# Patient Record
Sex: Male | Born: 1960 | Race: White | Hispanic: No | Marital: Married
Health system: Southern US, Community
[De-identification: ages and names within clinical notes are randomized; demographics above are authoritative.]

---

## 2004-12-25 ENCOUNTER — Emergency Department: Payer: Self-pay | Admitting: Emergency Medicine

## 2009-03-25 ENCOUNTER — Observation Stay: Payer: Self-pay | Admitting: Internal Medicine

## 2011-03-18 ENCOUNTER — Inpatient Hospital Stay: Payer: Self-pay | Admitting: Internal Medicine

## 2011-03-18 LAB — CK TOTAL AND CKMB (NOT AT ARMC)
CK, Total: 65 U/L (ref 35–232)
CK-MB: 2.2 ng/mL (ref 0.5–3.6)

## 2011-03-18 LAB — COMPREHENSIVE METABOLIC PANEL
Albumin: 3.6 g/dL (ref 3.4–5.0)
Anion Gap: 14 (ref 7–16)
Calcium, Total: 8.9 mg/dL (ref 8.5–10.1)
Chloride: 102 mmol/L (ref 98–107)
Co2: 23 mmol/L (ref 21–32)
EGFR (Non-African Amer.): >60
Osmolality: 281 (ref 275–301)
Potassium: 4.3 mmol/L (ref 3.5–5.1)
SGOT(AST): 28 U/L (ref 15–37)
Total Protein: 7.5 g/dL (ref 6.4–8.2)

## 2011-03-18 LAB — CBC
HCT: 52.8 % — ABNORMAL HIGH (ref 40.0–52.0)
HGB: 17.8 g/dL (ref 13.0–18.0)
MCH: 30.3 pg (ref 26.0–34.0)
MCHC: 33.7 g/dL (ref 32.0–36.0)
MCV: 90 fL (ref 80–100)
Platelet: 155 10*3/uL (ref 150–440)
RBC: 5.86 10*6/uL (ref 4.40–5.90)
RDW: 13.3 % (ref 11.5–14.5)
WBC: 12.9 10*3/uL — ABNORMAL HIGH (ref 3.8–10.6)

## 2011-03-18 LAB — APTT: Activated PTT: 34.4 secs (ref 23.6–35.9)

## 2011-03-18 LAB — HEMOGLOBIN A1C: Hemoglobin A1C: 6.6 % — ABNORMAL HIGH (ref 4.2–6.3)

## 2011-03-18 LAB — TROPONIN I
Troponin-I: 0.17 ng/mL — ABNORMAL HIGH
Troponin-I: 0.18 ng/mL — ABNORMAL HIGH

## 2011-03-18 LAB — PRO B NATRIURETIC PEPTIDE: B-Type Natriuretic Peptide: 1654 pg/mL — ABNORMAL HIGH (ref 0–125)

## 2011-03-18 LAB — PROTIME-INR
INR: 1
Prothrombin Time: 13.7 secs (ref 11.5–14.7)

## 2011-03-20 LAB — APTT: Activated PTT: 49.2 secs — ABNORMAL HIGH (ref 23.6–35.9)

## 2012-11-16 IMAGING — CT CT CHEST W/ CM
2 series · 15 of 35 positions shown, 18 images · IV contrast (APPLIED)
Comparison: none

REASON FOR EXAM: COMMENTS:

[Series 5: soft tissue · axial · 0.75mm/px · z∈[-280,-2]mm · 12 of 111 slices shown, 15 images]
[im 9/111  mediastinal]
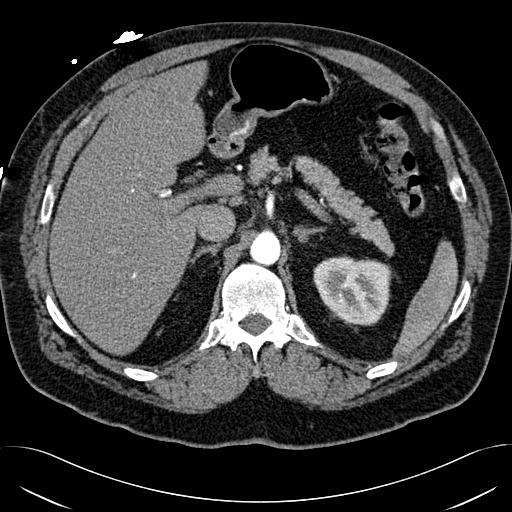
[im 9/111  lung]
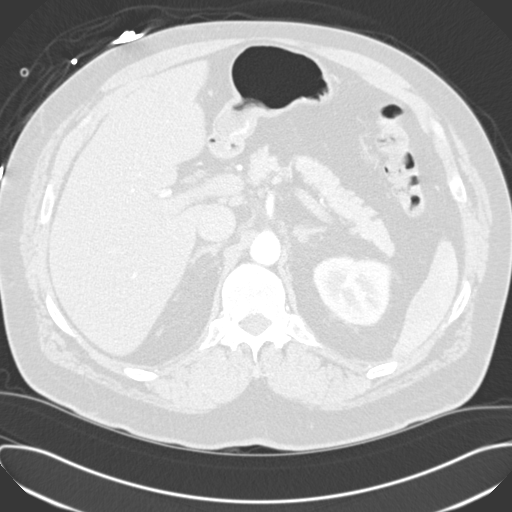
[im 17/111  lung]
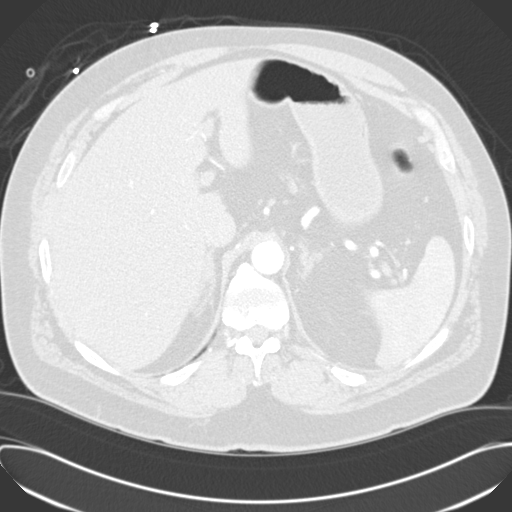
[im 25/111  lung]
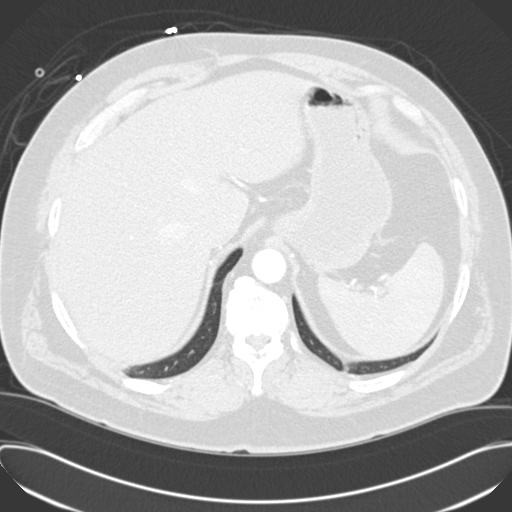
[im 33/111  lung]
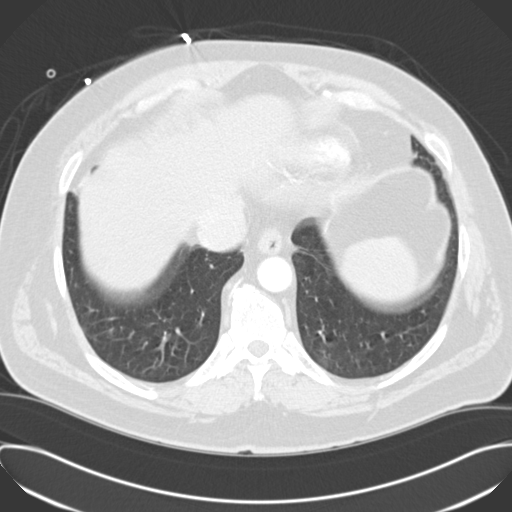
[im 45/111  mediastinal]
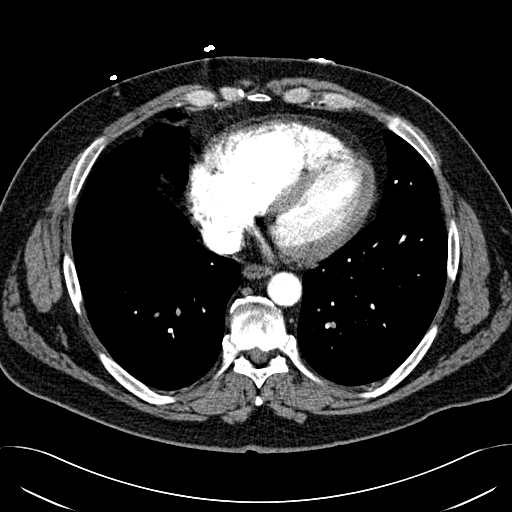
[im 45/111  lung]
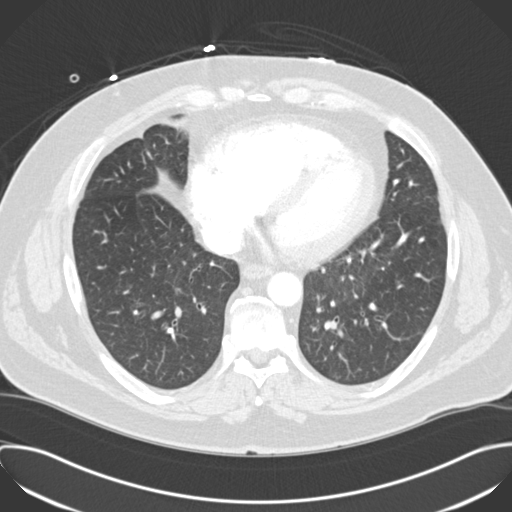
[im 53/111  lung]
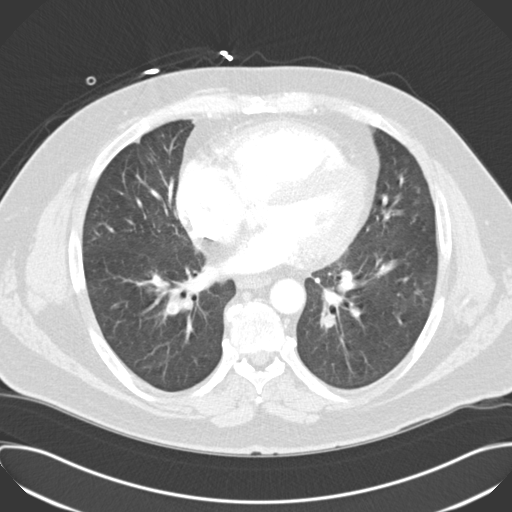
[im 59/111  lung]
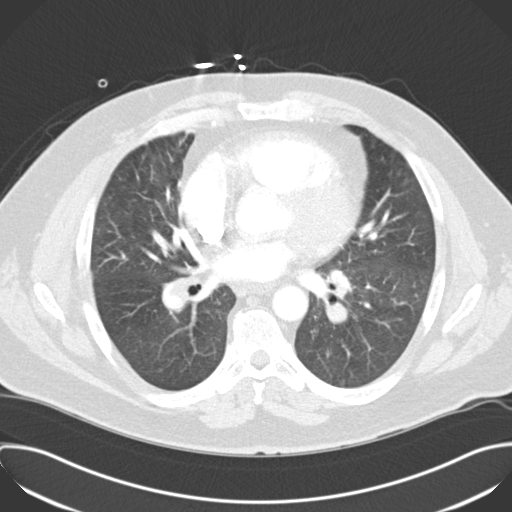
[im 66/111  lung]
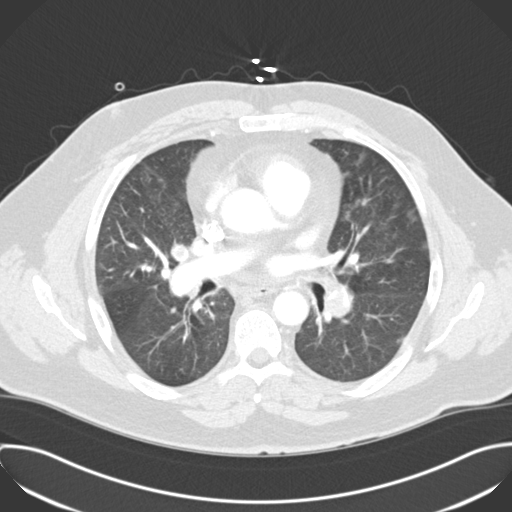
[im 78/111  mediastinal]
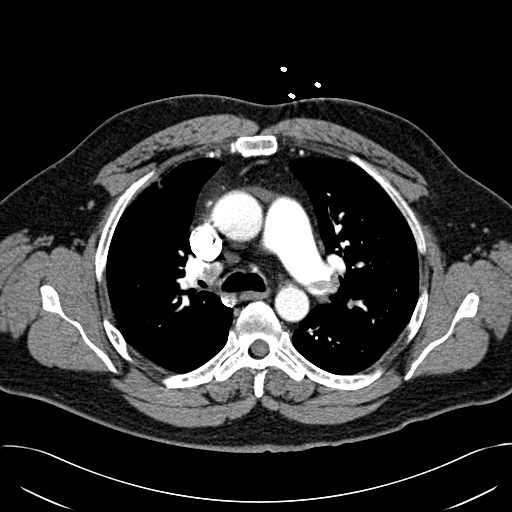
[im 78/111  lung]
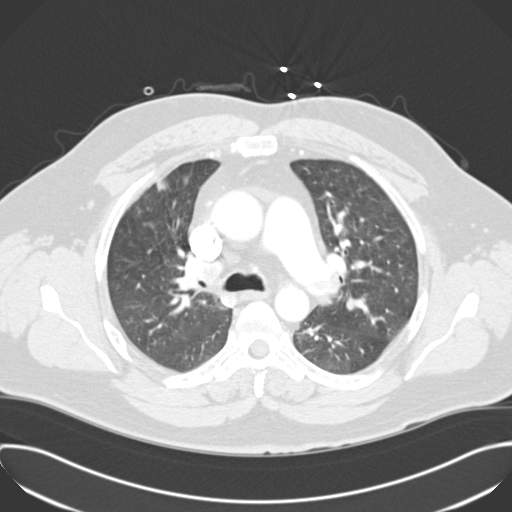
[im 86/111  lung]
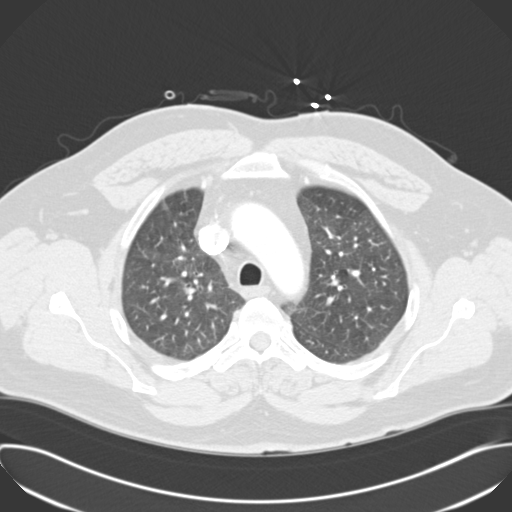
[im 94/111  lung]
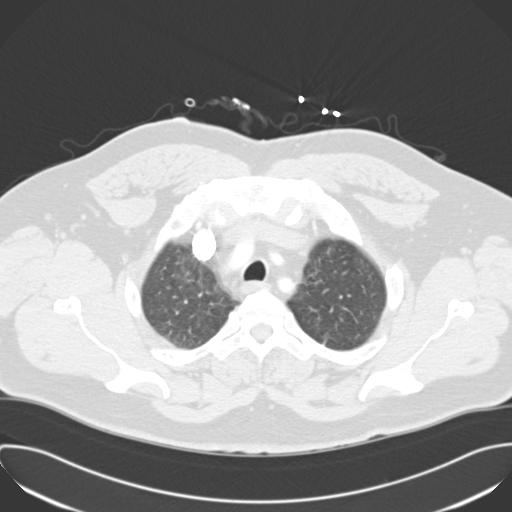
[im 102/111  lung]
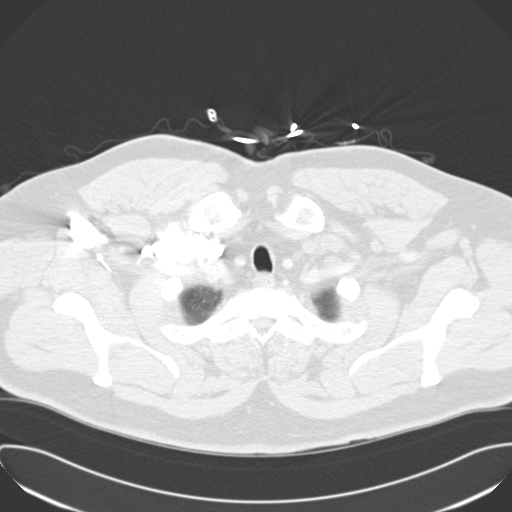

[Series 602: coronals · coronal · 0.75mm/px · 3 of 48 slices shown]
[im 10/48  lung]
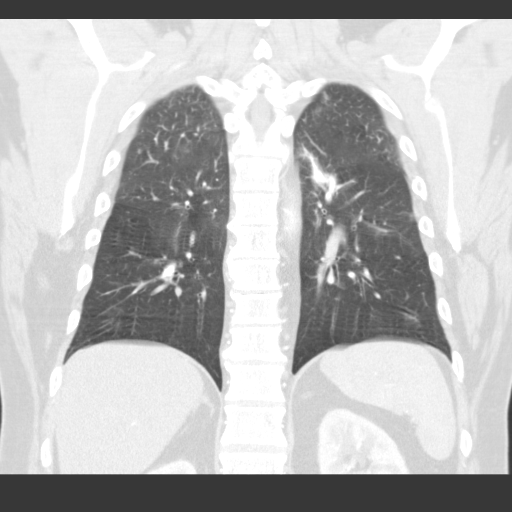
[im 19/48  lung]
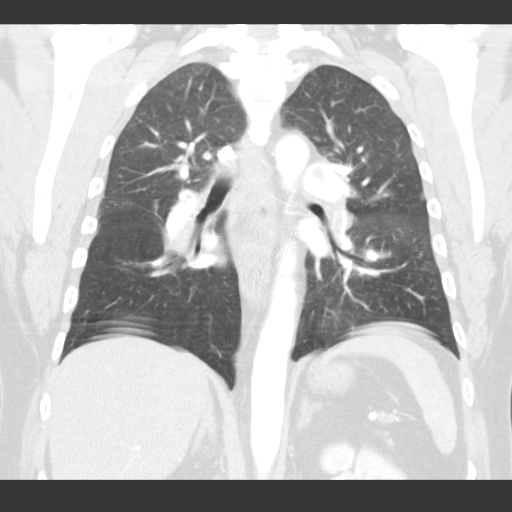
[im 29/48  lung]
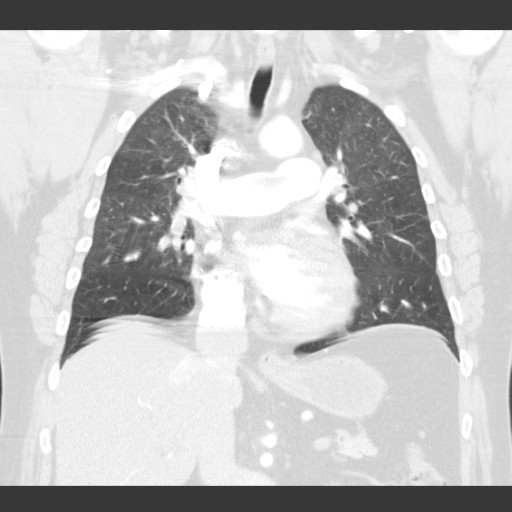

[15 of 35 positions shown; findings below may reference images not displayed]

PROCEDURE:     CT  - CT CHEST (FOR PE) W  - March 18, 2011  [DATE]

RESULT:      Axial CT scanning was performed through the chest with
reconstructions at 3 mm intervals and slice thicknesses following
intravenous administration of 100 cc of Dsovue-VXY. Review of multiplanar
reconstructed images was performed separately on the VIA monitor.

There are large filling defects in the right and left main pulmonary
arteries as well as in the major right and left branches of the pulmonary
arteries. The cardiac chambers are normal in size. The caliber of the
thoracic aorta is normal. There is no pleural nor pericardial effusion. I
see no bulky mediastinal or hilar lymph nodes. At lung window settings I do
not see alveolar infiltrates. There is minimal patchy pleural-based density
anteriorly in the right upper lobe on image 35. I do not see abnormal
nodules. Within the upper abdomen the observed portions of the liver exhibit
decreased density consistent with fatty infiltration. The spleen is not
enlarged. I see no adrenal masses. The bony thorax exhibits no acute
abnormality.
IMPRESSION: 1. There is a saddle pulmonary thrombus with multiple bilateral pulmonary
emboli involving both central and peripheral vessels. Minimal patchy density
in the anterior aspect of the right upper lobe could reflect focal pulmonary
infarction.
2. There is no overt evidence of CHF nor of pneumonia. I see no acute
thoracic aortic pathology.
3. There is no evidence of pleural nor pericardial effusions.

This report was discussed by telephone with Dr. Mirella in the emergency
department by the [REDACTED] physician at [DATE] a.m. on [DATE]

(*)

## 2013-01-01 ENCOUNTER — Ambulatory Visit: Payer: Self-pay

## 2013-01-01 LAB — URINALYSIS, COMPLETE
Leukocyte Esterase: NEGATIVE
Ph: 6 (ref 4.5–8.0)

## 2014-05-26 NOTE — Consult Note (Signed)
Brief Consult Note: Diagnosis: PE/SOB/Elevated troponin.   Patient was seen by consultant.   Consult note dictated.   Recommend further assessment or treatment.   Orders entered.   Discussed with Attending MD.   Comments: IMP Lagre PE Obesity Elevated troponin SOB Smoking . PLAN EKGs  02 Anticoug with heparin Which to long term anticoug Consider hematology input ECHO Advise to quit smoking Conservative cardiology input.  Electronic Signatures: Dorothyann Pengallwood, Dwayne D (MD)  (Signed 14-Feb-13 14:06)  Authored: Brief Consult Note   Last Updated: 14-Feb-13 14:06 by Alwyn Peaallwood, Dwayne D (MD)

## 2014-05-26 NOTE — H&P (Signed)
PATIENT NAME:  Benjamin Arellano, Benjamin Arellano MR#:  [id number] DATE OF BIRTH:  May 09, 1960  DATE OF ADMISSION:  03/18/2011  PRIMARY CARE PHYSICIAN: No local doctor, he sees a physician in [place].  CHIEF COMPLAINT: Increased shortness of breath and lightheadedness.   HISTORY OF PRESENT ILLNESS: Benjamin Arellano is a 54 year old Caucasian male with unremarkable past medical history apart from tobacco abuse. He was in his usual state of health until this Monday, about three days ago, when he started to feel more shortness of breath. In fact he says that his shortness of breath was gradual for the last one month associated with cough and bronchitis-like symptoms at that time; however, three days ago it was more intense and associated with lightheadedness to the extent that when he walks he feels that he is going to faint. He also reported sweating. He denies having any chest pain, but reported that when he is breathing hard he will feel some chest discomfort or upon coughing. He denies having any sudden onset of chest pain and no syncope. He has no fever and no chills.   REVIEW OF SYSTEMS: CONSTITUTIONAL: Denies fever. No chills. He admits having sweating. EYES: No blurring of vision. No double vision. ENT: No hearing impairment. No sore throat. No dysphagia. CARDIOVASCULAR: No chest pain. Only mild pleuritic pain upon coughing. Admits having shortness of breath, as above. No edema. No syncope, but has near syncope feeling. RESPIRATORY: Admits having shortness of breath and mild pleuritic chest pain. No wheezing. GASTROINTESTINAL: No abdominal pain, no nausea, no vomiting, and no diarrhea. GENITOURINARY: No dysuria or frequency of urination. MUSCULOSKELETAL: No joint swelling or pain. No muscular pain or swelling. However, he has some back pain that started here while he is lying down on the stretcher. INTEGUMENTARY: No skin rash. No ulcers. NEUROLOGY: No focal weakness. No seizure activity. He has mild headache. PSYCHIATRY:  Denies depression. He is slightly anxious here. ENDOCRINE: Admits having sweating, but no heat or cold intolerance.   PAST MEDICAL HISTORY:  1. He has history of kidney stones, otherwise he is healthy. 2. Chronic smoking.  3. One episode of pneumonia two years ago.   SOCIAL HABITS: Chronic smoker of 1 pack per day since age 46. No history of alcohol abuse.   FAMILY HISTORY: Negative for pulmonary embolus or any lung disease. Negative for premature coronary artery disease. He reports that his grandfather had a myocardial infarction but that was in his late 31s.   SOCIAL HISTORY: He is married and living with his wife. He owns a body shop.   ADMISSION MEDICATIONS:  1. PRN intake of hydrocodone when he has a kidney stone. 2. Occasional Naprosyn.   ALLERGIES: No known drug allergies.   PHYSICAL EXAMINATION:   VITAL SIGNS: Blood pressure 98/70, respiratory rate 22, pulse 100.   GENERAL APPEARANCE: Middle-aged male lying down in the bed, in no acute distress.   HEAD/NECK: No pallor. No icterus. No cyanosis.  EARS, NOSE, AND THROAT:  Hearing was normal. Nasal mucosa, lips, and tongue were normal.   EYES: Normal eyelids and conjunctiva. Pupils are about 4 to 5 mm, equal and reactive to light.   NECK: Supple. Trachea at midline. No thyromegaly. No cervical lymphadenopathy. No masses.   HEART: Normal S1 and S2. No S3 or S4. No murmur. No gallop. No carotid bruits.   LUNGS: Normal breathing pattern without use of accessory muscles. No rales. No wheezing.   ABDOMEN: Soft without tenderness. No hepatosplenomegaly. No masses. No hernias.   MUSCULOSKELETAL:  No joint swelling. No clubbing.   SKIN: No ulcers. No subcutaneous nodules.   NEUROLOGIC: Cranial nerves II through XII were intact. No focal motor deficit.   PSYCHIATRIC: The patient is alert and oriented x3. Mood and affect were flat.   LABS/STUDIES: EKG showed sinus tachycardia at rate of 105 per minute. Tendency towards low  voltage. Poor progression of R waves in the anterior chest leads and incomplete right bundle branch block. Inverted T waves in leads V1 to V4.   CAT or CTA of the chest revealed saddle pulmonary embolus with extension into the segmental pulmonary arteries of all lungs bilaterally. Minor parenchymal opacities which could be secondary to ischemic change.   B-type natriuretic peptide was significantly elevated at 1654. Glucose was 186, BUN 10, creatinine 0.9, serum sodium 139, and potassium 4.3. Liver function tests were normal. Total CPK is 65. Normal CK-MB fraction, 2.2. However, troponin was elevated to 0.17. CBC showed white count of 12,000, hemoglobin 17, hematocrit 52, and platelet count 155. Prothrombin time was 13, INR 1, APTT 34, and d-dimer was elevated at 4.1.   ASSESSMENT:  1. Pulmonary embolus in the form of saddle pulmonary embolus. 2. Mild elevation of troponin and abnormal EKG. This could be either secondary to the pulmonary embolus with right ventricular strain or myocardial ischemia secondary to coronary artery disease given his heavy smoking history.  3. Elevated blood sugar, may indicate underlying diabetes. I will check his Hemoglobin A1c.  4. Tobacco abuse.  5. History of kidney stones.   PLAN: Admit the patient to the Intensive Care Unit. IV heparin was already started in the emergency department. I will consult pulmonary. At the time being, there is no indication to use TPA as there is no significant hemodynamic compromise. I ordered an echocardiogram, and I will also obtain cardiology consult. I will avoid adding beta blocker in this setting, since his blood pressure is borderline, and we will await cardiology input. Peptic ulcer disease prophylaxis          will be initiated with Protonix 40 mg a day. Deep vein thrombosis prophylaxis is already started with heparin. I advised the patient to quit smoking indefinitely. I placed him on a nicotine patch.   TIME NEEDED TO  EVALUATE THIS PATIENT: More than one hour.  ____________________________ Benjamin CornersAmir M. Rudene Rearwish, MD amd:slb D: 03/18/2011 04:37:58 ET T: 03/18/2011 08:44:14 ET JOB#: [id number]  cc: Benjamin CornersAmir M. Rudene Rearwish, MD, <Dictator> Zollie ScaleAMIR M Virgilene Stryker MD ELECTRONICALLY SIGNED 03/26/2011 22:17

## 2014-05-26 NOTE — Discharge Summary (Signed)
PATIENT NAME:  Benjamin Arellano, Benjamin Arellano MR#:  [id number] DATE OF BIRTH:  Sep 06, 1960  DATE OF ADMISSION:  03/18/2011 DATE OF DISCHARGE:  03/20/2011  DIAGNOSES:  1. Pulmonary embolism with right lower extremity deep vein thrombosis.  2. Elevated troponin due to demand ischemia resulting from the pulmonary embolism. 3. Hyperglycemia. 4. Leukocytosis.  5. Tobacco abuse.  6. History of kidney stones.   DISPOSITION: The patient is being discharged home.   FOLLOWUP: Follow up with PCP in 1 to 2 days. The patient has been advised to get a PT-INR check-up on 03/22/2011, Monday, at his primary care physician's office.   DIET: Regular.   ACTIVITY: As tolerated.   DISCHARGE MEDICATIONS:  1. Coumadin 10 mg daily.  2. Lovenox 120 mg subcutaneously b.i.d. for five days. Discontinue when INR is more than 2.  LABORATORY, DIAGNOSTIC, AND RADIOLOGICAL DATA: CT scan of the chest showed subtle pulmonary thrombus with multiple bilateral pulmonary emboli. Lower extremity Doppler showed right deep vein thrombosis. Echo: LV grossly normal in size. There is no thrombus. Left ventricular systolic function is normal. Ejection fraction more than 55%. Normal left ventricular wall thickness. Right ventricular systolic function is normal. CBC normal other than white count 12.9. Troponins ranging from 0.18 to 0.13. Glucose 186 with hemoglobin of 6.6.   CONSULTATIONS:  1. Vascular surgery consultation with Dr. Wyn Quakerew.  2. Pulmonary consultation with Dr. Welton FlakesKhan. 3. Cardiology consultation with Dr. Juliann Paresallwood.   PROCEDURES: IVC filter placement.   HOSPITAL COURSE: The patient is a 54 year old male with past medical history of kidney stones who presented with pleuritic type chest pain and shortness of breath. A CT scan of the chest revealed saddle pulmonary embolus. The patient was also hypoxic. Initially he was monitored for any hemodynamic instability in the Intensive Care Unit for 24 hours. Fortunately, he did not develop any. He  was treated with IV heparin drip and oral Coumadin. A vascular surgery consultation with Dr. Wyn Quakerew was obtained and the patient underwent IVC filter placement on 02/15/2011. Pulmonary and cardiology consultations were also obtained because of extensive pulmonary embolus. Conservative management was recommended. The patient had elevated troponins and BNP with abnormal EKG due to right heart strain resulting from extensive pulmonary embolus. His echo was normal. He also had hyperglycemia with hemoglobin A1c of 6.3. The patient had an extensive history of smoking and was counseled; however, he is currently not interested in quitting. The patient was desirous of going home. Therefore, he was taught to self-administer subcutaneous Lovenox. He is being discharged home on subcutaneous Lovenox and oral Coumadin. He has been advised to have a PT-INR check-up in his primary care physician's office on 03/22/2011.  He is to discontinue the Lovenox once his INR is more than 2.  The patient is being discharged home in stable condition.   TIME SPENT: 45 minutes.    ____________________________ Darrick MeigsSangeeta Mikayla Chiusano, MD sp:bjt D: 03/20/2011 13:32:21 ET T: 03/21/2011 10:01:54 ET JOB#: [id number]  cc: Darrick MeigsSangeeta Kynley Metzger, MD, <Dictator> Darrick MeigsSANGEETA Deniesha Stenglein MD ELECTRONICALLY SIGNED 03/23/2011 11:28

## 2014-05-26 NOTE — Op Note (Signed)
PATIENT NAME:  Benjamin Arellano, Benjamin Arellano MR#:  [id number] DATE OF BIRTH:  Jun 25, 1960  DATE OF PROCEDURE:  03/18/2011  PREOPERATIVE DIAGNOSES:  1. Submassive PE with tachycardia and heart strain.  2. Residual right lower extremity DVT.   POSTOPERATIVE DIAGNOSES: 1. Submassive PE with tachycardia and heart strain.  2. Residual right lower extremity DVT.   PROCEDURES: 1. Ultrasound guidance for vascular access, right femoral vein.  2. Catheter placement into inferior vena cava.  3. Inferior venacavogram.  4. Placement of a [employer] IVC filter.   SURGEON: Annice NeedyJason S. Jamieson Lisa, MD   ANESTHESIA: Local with Versed in addition.   ESTIMATED BLOOD LOSS: Minimal.   FLUOROSCOPY TIME: Less than one minute.  CONTRAST USED: 15 mL.    INDICATION FOR PROCEDURE: This is a 54 year old male who presented today with shortness of breath and chest pain and was found to have a submassive pulmonary embolus. He has tachycardia and elevated BNP consistent with heart strain. He has residual right lower extremity DVT. For these reasons, we are consulted for IVC filter. Risks and benefits were discussed. Informed consent was obtained.   DESCRIPTION OF PROCEDURE: The patient was brought to the Vascular Interventional Radiology Suite. Groins were shaved and prepped and a sterile surgical field was created. The right femoral vein was visualized with ultrasound and found to be widely patent. It was then accessed under direct ultrasound guidance without difficulty with a Seldinger needle and permanent image was recorded. J-wire was placed. After skin nick and dilatation, the delivery sheath was placed into the inferior vena cava and inferior venacavogram was performed. This showed a patent vena cava at the level of the renal veins at approximately L1. The filter was then deployed at the L1-L2 interspace. The delivery sheath was removed. Pressure was held. Sterile dressing was placed. The patient tolerated the procedure well and was  taken to the recovery room in stable condition.   ____________________________ Annice NeedyJason S. Oryon Gary, MD jsd:drc D: 03/18/2011 16:25:31 ET T: 03/18/2011 17:13:44 ET JOB#: [id number]  cc: Annice NeedyJason S. Jisela Merlino, MD, <Dictator> Annice NeedyJASON S Merelyn Klump MD ELECTRONICALLY SIGNED 03/22/2011 10:33

## 2014-05-26 NOTE — Consult Note (Signed)
PATIENT NAME:  Benjamin Arellano, Maher C MR#:  [id number] DATE OF BIRTH:  [birth year]  DATE OF CONSULTATION:  03/18/2011  REFERRING PHYSICIAN:  [facility] CONSULTING PHYSICIAN:  Kamaal Cast D. Conall Vangorder, MD  PRIMARY CARE PHYSICIAN:    INDICATION: Shortness of breath, near syncope, elevated troponins in the setting of large pulmonary embolus.   HISTORY OF PRESENT ILLNESS: Benjamin Arellano is a 54 year old white male smoker, history of  kidney stones, obesity, possible hypertension, no history of pneumonia. He states that he was in his normal state of health, over the last few days started having recurrent shortness of breath with any exertion, even minimally, with no syncope. He has had some bronchitis-like symptoms with shortness of breath but no sputum.  He got lightheaded several times, fainting feeling. Mild recurrent short-lived chest pain. Symptoms have been going on for 2 to 3 days. Finally he had an episode where he almost passed out with just minimal exertion with extreme dyspnea, shortness of breath, and lightheadedness, so he finally came to the Emergency Room for evaluation.   REVIEW OF SYSTEMS: Near syncope, near blackout spells or but no frank syncope. No nausea or vomiting. No fever, no chills, no sweats. Denies weight loss, weight gain, hemoptysis, hematemesis. Denies blurry or double vision, vision change, hearing change. Denies sputum production. He has had some cough, some weakness and fatigue. Denied leg pain or swelling. No hemoptysis.   PAST MEDICAL HISTORY:  1. Kidney stones.  2. Obesity.  3. Smoking.   FAMILY HISTORY: Myocardial infarction, otherwise negative.   SOCIAL HISTORY: Smoker. [place] in [place], drives about an hour each way to work everyday. Very active. No recent travel. No recent surgery. No significant recent illness.   MEDICATIONS:  1. P.r.n. hydrocodone for chronic kidney stones. 2. Occasional Naprosyn.   ALLERGIES: None.   PHYSICAL EXAMINATION:  VITAL SIGNS:  Blood pressure 100/70, pulse 100, respiratory rate 22, afebrile.   HEENT: Normocephalic, atraumatic. Pupils equal and reactive to light.   NECK: Supple. No jugular venous distention, bruit, or adenopathy.   LUNGS: Clear to auscultation and percussion. Mild rhonchi. No wheezing. No rales. Adequate air movement. No use of accessory muscles.   HEART: Regular rate and rhythm, slightly tachycardic. Systolic ejection murmur left sternal border. PMI is nondisplaced.   ABDOMEN: Benign. Positive bowel sounds. No rebound, guarding, or tenderness.   EXTREMITIES: Within normal limits. No cyanosis, clubbing, or edema.   NEUROLOGICAL:  Intact.  SKIN: Normal   LABS/STUDIES:  EKG: Sinus tachycardia, rate of 100. Nonspecific ST-T changes, poor progression, incomplete right bundle branch block, mild T wave inversions anteriorly. CTA suggest saddle pulmonary embolus with segmental pulmonary artery involvement bilaterally. BNP 1600. Glucose 186, BUN 10, creatinine 0.9, sodium 139, potassium 4.3. LFTs negative. CK 65, MB 2.2. Troponin 0.17, white count 12, hemoglobin 17, hematocrit 52, platelet count 155, PT 13. PTT 34, D-dimer 4.1.   ASSESSMENT:  Large pulmonary embolus, saddle embolus, elevated troponin, chest pain, shortness of breath, smoking, tachycardia, elevated blood sugars, kidney stones, obesity.    PLAN: Agree with admit. Place on anticoagulation acutely. He will need long-term anticoagulation as well. Would consider possibility for IVC filter. Recommend venous Dopplers of the legs to evaluate for thrombus burden. Bedrest for now. Oxygen supplementation. Echocardiogram to assess for LV strain. Follow up cardiac enzymes.  Follow up EKGs. Would recommend critical care support in the interim. May consider blood sugar control. Advised the patient to quit smoking. Do not recommend invasive cardiac studies at this point unless the patient becomes  unstable. I would recommend hematology evaluation for  hypercoagulable state. There are no clearcut risk factors for deep vein thrombosis and PE in this patient except for long drives regularly but he has been very active. Would continue anticoagulation in the interim and involve vascular for short-term IVC filter to protect him from further pulmonary embolus from any potential deep vein thromboses.     ____________________________ Bobbie Stack Juliann Pares, MD ddc:bjt D: 03/18/2011 21:24:48 ET T: 03/19/2011 10:28:04 ET JOB#: [id number]  cc: Thornton Dohrmann D. Juliann Pares, MD, <Dictator> Alwyn Pea MD ELECTRONICALLY SIGNED 04/20/2011 10:04

## 2014-05-26 NOTE — Consult Note (Signed)
Admit Diagnosis:   PULMONARY EMBOLISM: 18-Mar-2011, Active, PULMONARY EMBOLISM    General Aspect PE    Present Illness Patient admitted with shortness of breath, tachycardia, found to have very large PE.  BNP elevated c/w heart strain.  Has residual RLE DVT and we are asked to evaluate for IVC filter.  ROS negative other than right leg pain and swelling and SOB, tachycardia     Aspirin Chewable, 324 mg Oral once  - Indication: Pain/Fever/Thromboembolic Disorders/Post MI/Prophylaxis MI, 18-Mar-2011, Completed, Standard   HePARin Bolus, 5000 unit(s), IV push, once  Indication: Anticoagulant, Monitor Anticoags per hospital protocol, 60 units/kg IV x 1 LOADING DOSE., 18-Mar-2011, Completed, Standard   MorphINE  injection, 4 mg, IV push, once  Indication: Pain, 18-Mar-2011, Completed, Standard   Acetaminophen * tablet, ( Tylenol (325 mg) tablet)  650 mg Oral q4h PRN for pain or temp. greater than 100.4  - Indication: Pain/Fever, 18-Mar-2011, Active, Standard   Acetaminophen-HYDROcodone 325/5 mg tablet, ( Norco  5/325 mg)  1 to 2 tablet(s) Oral q4h PRN for pain  - Indication: Pain, 18-Mar-2011, Active, Standard   Aspirin Enteric Coated tablet, ( Ecotrin)  81 mg Oral daily  - Indication: Pain/Fever/Thromboembolic Disorders/Post MI/Prophylaxis MI  Instructions:  Initiate Bleeding Precautions Protocol--DO NOT CRUSH, 18-Mar-2011, Active, Standard   Nicotine Patch, ( Habitrol patch )  21 mg Transdermal daily  -Indication:Smoking Cessation  Instructions:  [Waste Code: Black with pkg], 18-Mar-2011, Active, Standard   Pantoprazole tablet, 40 mg Oral q6am  - Indication: Erosive Esophagitis/ GERD  Instructions:  DO NOT CRUSH, 18-Mar-2011, Active, Standard   Sodium Chloride 0.9% injection, 2 ml, IV push, q6h, 18-Mar-2011, Active, Standard  Home Medications: Medication Instructions Status  hydrocodone 5/54m takes one in the am and one at bedtime Active    No Known Allergies:    Case History and Physical Exam:   Chief Complaint Chest Pain  Shortness of Breath    Past Surgical History None    Family History Non-Contributory    HEENT PERLA    Neck/Nodes Supple  No Adenopathy    Chest/Lungs Clear    Breasts Not examined    Cardiovascular slight tachycardia, regular    Abdomen Benign    Musculoskeletal Full range of motion    Neurological Grossly WNL    Skin WNL   Nursing/Ancillary Notes: **Vital Signs.:   14-Feb-13 15:00   Vital Signs Type Routine   Pulse Pulse 98   Pulse source per cardiac monitor   Respirations Respirations 24   Systolic BP Systolic BP 1132  Diastolic BP (mmHg) Diastolic BP (mmHg) 67   Mean BP 83   BP Source non-invasive   Pulse Ox % Pulse Ox % 94   Pulse Ox Activity Level  At rest   Oxygen Delivery 3L; Nasal Cannula   Pulse Ox Heart Rate 100   Routine Chem:  14-Feb-13 00:33    B-Type Natriuretic Peptide ([facility] 1654  Cardiac:  14-Feb-13 00:33    CK, Total 65   CPK-MB, Serum 2.2  Routine Chem:  14-Feb-13 00:33    Glucose, Serum 186   BUN 10   Creatinine (comp) 0.92   Sodium, Serum 139   Potassium, Serum 4.3   Chloride, Serum 102   CO2, Serum 23   Calcium (Total), Serum 8.9  Hepatic:  14-Feb-13 00:33    Bilirubin, Total 0.7   Alkaline Phosphatase 51   SGPT (ALT) 28   SGOT (AST) 28   Total Protein, Serum 7.5   Albumin, Serum  3.6  Routine Chem:  14-Feb-13 00:33    Osmolality (calc) 281   eGFR (African American) >60   eGFR (Non-African American) >60   Anion Gap 14  Routine Coag:  14-Feb-13 00:33    D-Dimer, Quantitative 4.10  Routine Hem:  14-Feb-13 00:33    WBC (CBC) 12.9   RBC (CBC) 5.86   Hemoglobin (CBC) 17.8   Hematocrit (CBC) 52.8   Platelet Count (CBC) 155   MCV 90   MCH 30.3   MCHC 33.7   RDW 13.3  Cardiac:  14-Feb-13 00:33    Troponin I 0.17  Routine Coag:  14-Feb-13 00:33    Activated PTT (APTT) 34.4   Prothrombin 13.7   INR 1.0  Blood Glucose:  14-Feb-13 05:43    POCT  Blood Glucose 112    07:19    POCT Blood Glucose 112  Cardiac:  14-Feb-13 08:56    Troponin I 0.18  Routine Coag:  14-Feb-13 08:56    Activated PTT (APTT) 75.7  Routine Chem:  14-Feb-13 08:56    Hemoglobin A1c [facility]) 6.6   Radiology Results: XRay:    14-Feb-13 00:34, Chest PA and Lateral   Chest PA and Lateral    REASON FOR EXAM:    sob  COMMENTS:   May transport without cardiac monitor    PROCEDURE: DXR - DXR CHEST PA (OR AP) AND LATERAL  - Mar 18 2011 12:34AM     RESULT: Comparison is made to the study of 25 March 2009.    Central pulmonary vascular prominence is demonstrated. There is no overt   interstitial edema, effusion or pneumothorax. The bony and mediastinal   structures are unremarkable.    IMPRESSION:   1. Pulmonary vascular congestion.        Verified By: Sundra Aland, M.D., MD  [place]:    14-Feb-13 13:09, [place] Color Flow Doppler Low Extrem Bilat   [place] Color Flow Doppler Low Extrem Bilat    REASON FOR EXAM:    DVT and Pulmonary embolus  COMMENTS:   LMP: (Male)    PROCEDURE: [place]  - US DOPPLER LOW EXTR BILATERAL  - Mar 18 2011  1:09PM     RESULT: Doppler interrogation of the deep venous system of the left leg   demonstrates normal compressibility. The color and spectral Doppler   appearance is normal. No abnormal fluid collection is seen.    Doppler interrogation of the right leg demonstrates nonocclusive thrombus   within the right popliteal vein and distal portion of the superficial   femoral vein. Common femoral vein is patent and compresses normally.    IMPRESSION:   1. Findings positive for nonocclusive thrombus in the right popliteal     vein and distal superficial femoral vein.(*)          Verified By: Sundra Aland, M.D., MD  CT:    14-Feb-13 02:37, CT Chest for Pulm Embolism With Contrast   CT Chest for Pulm Embolism With Contrast    REASON FOR EXAM:      COMMENTS:       PROCEDURE: CT  - CT CHEST (FOR PE) W  - Mar 18 2011   2:37AM     RESULT:  Axial CT scanning was performed through the chest with   reconstructions at 3 mm intervals and slice thicknesses following   intravenous administration of 100 cc of [id number]. Review of multiplanar   reconstructed images was performed separately on the VIA monitor.    There are  large filling defects in the right and left main pulmonary   arteries as well as in the major right and left branches of the pulmonary   arteries. The cardiac chambers are normal in size. The caliber of the   thoracic aorta is normal. There is no pleural nor pericardial effusion. I   see no bulky mediastinal or hilar lymph nodes. At lung window settings I     do not see alveolar infiltrates. There is minimal patchy pleural-based   density anteriorly in the right upper lobe on image 35. I do not see   abnormal nodules. Within the upper abdomen the observed portions of the   liver exhibit decreased density consistent with fatty infiltration. The   spleen is not enlarged. I see no adrenal masses. The bony thorax exhibits   no acute abnormality.    IMPRESSION:    1. There is a saddle pulmonary thrombus with multiple bilateral pulmonary   emboli involving both central and peripheral vessels. Minimal patchy   density in the anterior aspect of the right upper lobe could reflect   focal pulmonary infarction.  2. There is no overt evidence of CHF nor of pneumonia. I see no acute   thoracic aortic pathology.  3. There is no evidence of pleural nor pericardial effusions.  This report was discussed by telephone with Dr. Renee Ramus in the emergency   department by the [facility] physician at 2:51 a.m. on 18 March 2011.      (*)          Verified By: DAVID A. [place], M.D., MD     Impression Patient with very large PE, heart strain, tachycardia, SOB on oxygen.  Has residual LE DVT    Plan Place IVC filter today   Electronic Signatures: Algernon Huxley (MD)  (Signed 14-Feb-13  15:45)  Authored: Health Issues, General Aspect/Present Illness, Orders, Home Medications, Allergies, History and Physical Exam, Vital Signs, Labs, Radiology, Impression/Plan   Last Updated: 14-Feb-13 15:45 by Algernon Huxley (MD)

## 2014-09-02 IMAGING — CR DG CHEST 2V
1 series · 2 of 2 positions shown · non-contrast
Comparison: Chest radiograph March 18, 2011; CT angiogram chest
March 18, 2011.

CLINICAL DATA: Cough and chills

EXAM:
CHEST  2 VIEW

[Series 1: pa · 0.17mm/px · 2 of 2 slices shown]
[im 1/2]
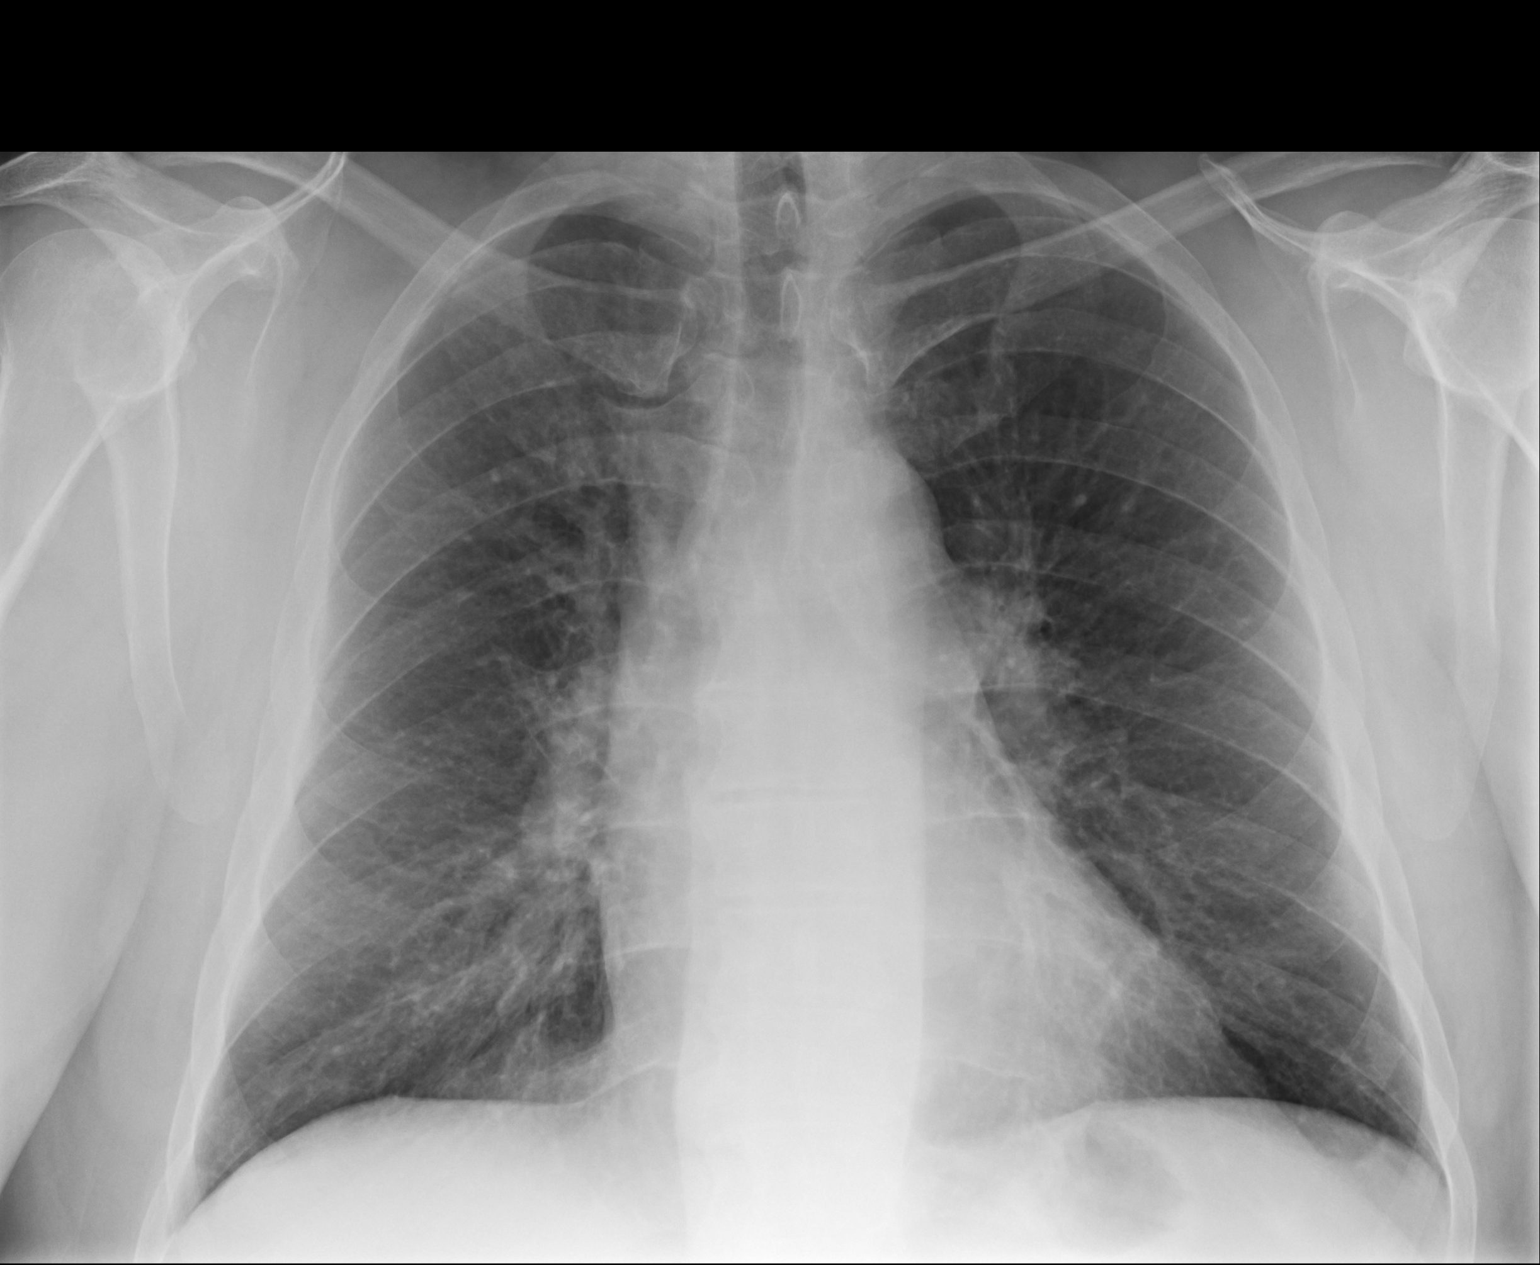
[im 2/2]
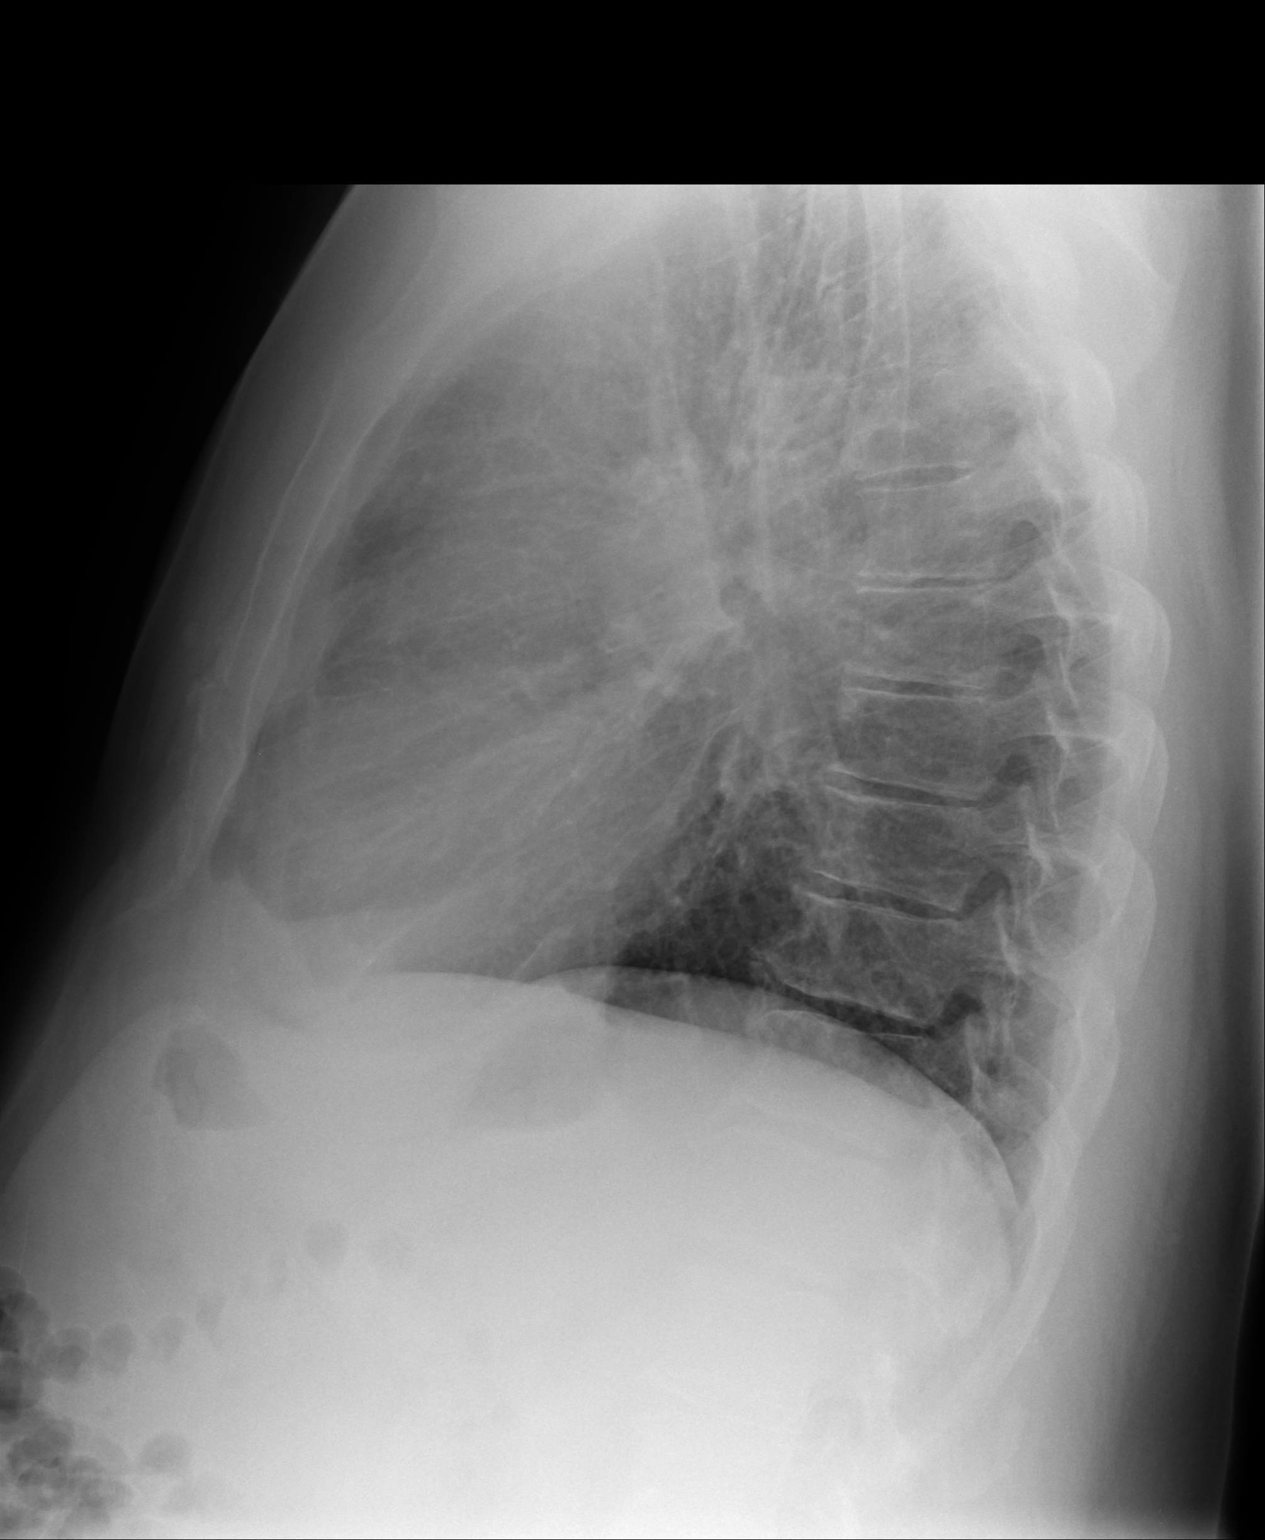

[2 of 2 positions shown; findings below may reference images not displayed]

FINDINGS: The lungs are clear. The heart size and pulmonary vascularity are
normal. No adenopathy. No bone lesions.
IMPRESSION: No abnormality noted.
